# Patient Record
Sex: Female | Born: 1970 | Race: White | Hispanic: No | Smoking: Never smoker
Health system: Southern US, Community
[De-identification: ages and names within clinical notes are randomized; demographics above are authoritative.]

## PROBLEM LIST (undated history)

## (undated) DIAGNOSIS — F329 Major depressive disorder, single episode, unspecified: Secondary | ICD-10-CM

## (undated) DIAGNOSIS — F32A Depression, unspecified: Secondary | ICD-10-CM

## (undated) DIAGNOSIS — K589 Irritable bowel syndrome without diarrhea: Secondary | ICD-10-CM

## (undated) DIAGNOSIS — F419 Anxiety disorder, unspecified: Secondary | ICD-10-CM

## (undated) DIAGNOSIS — R87619 Unspecified abnormal cytological findings in specimens from cervix uteri: Secondary | ICD-10-CM

## (undated) DIAGNOSIS — A630 Anogenital (venereal) warts: Secondary | ICD-10-CM

## (undated) DIAGNOSIS — N879 Dysplasia of cervix uteri, unspecified: Secondary | ICD-10-CM

## (undated) HISTORY — DX: Depression, unspecified: F32.A

## (undated) HISTORY — PX: TUBAL LIGATION: SHX77

## (undated) HISTORY — PX: COLPOSCOPY: SHX161

## (undated) HISTORY — DX: Anxiety disorder, unspecified: F41.9

## (undated) HISTORY — DX: Unspecified abnormal cytological findings in specimens from cervix uteri: R87.619

## (undated) HISTORY — DX: Irritable bowel syndrome, unspecified: K58.9

## (undated) HISTORY — DX: Anogenital (venereal) warts: A63.0

## (undated) HISTORY — DX: Major depressive disorder, single episode, unspecified: F32.9

## (undated) HISTORY — DX: Dysplasia of cervix uteri, unspecified: N87.9

---

## 1998-02-06 ENCOUNTER — Inpatient Hospital Stay (HOSPITAL_COMMUNITY): Admission: AD | Admit: 1998-02-06 | Discharge: 1998-02-08 | Payer: Self-pay | Admitting: Obstetrics & Gynecology

## 1999-09-28 ENCOUNTER — Other Ambulatory Visit: Admission: RE | Admit: 1999-09-28 | Discharge: 1999-09-28 | Payer: Self-pay | Admitting: Gynecology

## 2004-05-24 ENCOUNTER — Encounter: Admission: RE | Admit: 2004-05-24 | Discharge: 2004-05-24 | Payer: Self-pay | Admitting: *Deleted

## 2007-03-06 ENCOUNTER — Encounter: Admission: RE | Admit: 2007-03-06 | Discharge: 2007-03-06 | Payer: Self-pay | Admitting: Internal Medicine

## 2007-10-30 HISTORY — PX: CHOLECYSTECTOMY: SHX55

## 2007-11-19 ENCOUNTER — Encounter: Admission: RE | Admit: 2007-11-19 | Discharge: 2007-11-19 | Payer: Self-pay | Admitting: Gynecology

## 2009-02-15 ENCOUNTER — Encounter: Admission: RE | Admit: 2009-02-15 | Discharge: 2009-02-15 | Payer: Self-pay | Admitting: Obstetrics and Gynecology

## 2009-05-12 ENCOUNTER — Inpatient Hospital Stay (HOSPITAL_COMMUNITY): Admission: RE | Admit: 2009-05-12 | Discharge: 2009-05-12 | Payer: Self-pay | Admitting: Internal Medicine

## 2009-06-06 ENCOUNTER — Inpatient Hospital Stay (HOSPITAL_COMMUNITY): Admission: AD | Admit: 2009-06-06 | Discharge: 2009-06-10 | Payer: Self-pay | Admitting: Obstetrics and Gynecology

## 2009-06-07 ENCOUNTER — Encounter (INDEPENDENT_AMBULATORY_CARE_PROVIDER_SITE_OTHER): Payer: Self-pay | Admitting: Obstetrics and Gynecology

## 2010-02-06 LAB — HM PAP SMEAR

## 2010-08-17 ENCOUNTER — Encounter: Admission: RE | Admit: 2010-08-17 | Discharge: 2010-08-17 | Payer: Self-pay | Admitting: Obstetrics and Gynecology

## 2010-11-19 ENCOUNTER — Encounter: Payer: Self-pay | Admitting: Internal Medicine

## 2011-02-03 LAB — CBC
HCT: 20.6 % — ABNORMAL LOW (ref 36.0–46.0)
HCT: 22.5 % — ABNORMAL LOW (ref 36.0–46.0)
Hemoglobin: 7.2 g/dL — CL (ref 12.0–15.0)
Hemoglobin: 7.2 g/dL — CL (ref 12.0–15.0)
Hemoglobin: 7.8 g/dL — CL (ref 12.0–15.0)
MCHC: 33.9 g/dL (ref 30.0–36.0)
MCHC: 35 g/dL (ref 30.0–36.0)
MCV: 87.7 fL (ref 78.0–100.0)
MCV: 87.9 fL (ref 78.0–100.0)
Platelets: 170 10*3/uL (ref 150–400)
RBC: 2.36 MIL/uL — ABNORMAL LOW (ref 3.87–5.11)
RBC: 2.42 MIL/uL — ABNORMAL LOW (ref 3.87–5.11)
RBC: 3.49 MIL/uL — ABNORMAL LOW (ref 3.87–5.11)
RDW: 16.1 % — ABNORMAL HIGH (ref 11.5–15.5)
RDW: 16.3 % — ABNORMAL HIGH (ref 11.5–15.5)
RDW: 16.7 % — ABNORMAL HIGH (ref 11.5–15.5)
WBC: 12.1 10*3/uL — ABNORMAL HIGH (ref 4.0–10.5)
WBC: 8.9 10*3/uL (ref 4.0–10.5)

## 2011-02-03 LAB — RPR: RPR Ser Ql: NONREACTIVE

## 2011-02-05 LAB — DIFFERENTIAL
Band Neutrophils: 0 % (ref 0–10)
Basophils Absolute: 0 10*3/uL (ref 0.0–0.1)
Basophils Relative: 0 % (ref 0–1)
Eosinophils Absolute: 0.2 10*3/uL (ref 0.0–0.7)
Eosinophils Relative: 2 % (ref 0–5)
Metamyelocytes Relative: 0 %
Monocytes Absolute: 0.2 10*3/uL (ref 0.1–1.0)
Monocytes Relative: 2 % — ABNORMAL LOW (ref 3–12)
Myelocytes: 0 %
nRBC: 0 /100 WBC

## 2011-02-05 LAB — COMPREHENSIVE METABOLIC PANEL
ALT: 17 U/L (ref 0–35)
AST: 22 U/L (ref 0–37)
Albumin: 3 g/dL — ABNORMAL LOW (ref 3.5–5.2)
Calcium: 8.5 mg/dL (ref 8.4–10.5)
Creatinine, Ser: 0.56 mg/dL (ref 0.4–1.2)
GFR calc Af Amer: >60 mL/min (ref >60)
Sodium: 133 mEq/L — ABNORMAL LOW (ref 135–145)
Total Protein: 6.5 g/dL (ref 6.0–8.3)

## 2011-02-05 LAB — CBC
MCHC: 34.4 g/dL (ref 30.0–36.0)
MCV: 88.2 fL (ref 78.0–100.0)
Platelets: 168 10*3/uL (ref 150–400)
WBC: 9.8 10*3/uL (ref 4.0–10.5)

## 2011-02-26 ENCOUNTER — Ambulatory Visit
Admission: RE | Admit: 2011-02-26 | Discharge: 2011-02-26 | Disposition: A | Discharge status: Home / Self Care | Payer: PRIVATE HEALTH INSURANCE | Source: Ambulatory Visit | Attending: Obstetrics and Gynecology | Admitting: Obstetrics and Gynecology

## 2011-02-26 ENCOUNTER — Other Ambulatory Visit: Payer: Self-pay | Admitting: Obstetrics and Gynecology

## 2011-02-26 DIAGNOSIS — R05 Cough: Secondary | ICD-10-CM

## 2011-02-26 DIAGNOSIS — R059 Cough, unspecified: Secondary | ICD-10-CM

## 2011-03-07 ENCOUNTER — Ambulatory Visit: Payer: Self-pay | Admitting: Internal Medicine

## 2011-03-12 ENCOUNTER — Encounter: Payer: Self-pay | Admitting: Gastroenterology

## 2011-03-13 NOTE — Op Note (Signed)
NAMEANITRIA, ANDON            ACCOUNT NO.:  [id number]   MEDICAL RECORD NO.:  [id number]           PATIENT TYPE:   LOCATION:                                 FACILITY:   PHYSICIAN:  Guy Sandifer. Henderson Cloud, M.D. DATE OF BIRTH:  01-May-1971   DATE OF PROCEDURE:  06/07/2009  DATE OF DISCHARGE:                               OPERATIVE REPORT   PREOPERATIVE DIAGNOSES:  1. Arrest of descent.  2. Non-reassuring fetal heart tracing.  3. Desires permanent sterilization.   POSTOPERATIVE DIAGNOSES:  1. Arrest of descent.  2. Non-reassuring fetal heart tracing.  3. Desires permanent sterilization.   PROCEDURE:  Low-transverse cesarean section and bilateral tubal  ligation.   SURGEON:  Guy Sandifer. Henderson Cloud, MD   ANESTHESIA:  Epidural, Quillian Quince, MD   SPECIMENS:  Bilateral fallopian tube segments and placenta to Pathology.   FINDINGS:  Viable female infant, Apgars of 9 and 9 at one and five minutes  respectively.  Arterial cord pH 7.22.  Birth weight 7 pounds 5 ounces.   ESTIMATED BLOOD LOSS:  800 mL.   INDICATIONS AND CONSENT:  This patient is a 40 year old white female G3,  P2, EDC of June 11, 2009, who has a history of a traumatic delivery  with her first baby.  She was admitted for induction of labor at 39-1/2  weeks.  On the morning of June 07, 2009, cervix was completely  dilated, completely effaced, and the vertex was 0 to -1 station.  The  patient pushed for less than 30 minutes.  There were some deep variable  type decelerations.  Oxygen was administered.  The patient was allowed  to rest and labored down.  After approximately 30-40 minutes, the  decelerations remained and the vertex was unchanged as well.  Diagnosis  of arrest of descent and non-reassuring fetal heart tracing was made and  cesarean section was recommended.  Potential risks and complications  were discussed with the patient including, but not limited to infection,  organ damage, bleeding requiring  transfusion of blood products with HIV  and hepatitis acquisition, DVT, PE, pneumonia.  The patient also  requests tubal ligation.  Alternate methods permanence, increased  ectopic risk, failure rate of the procedure was reviewed.  All questions  were answered.  Consent was signed on the chart.   PROCEDURE:  The patient was taken to the operating room where her  epidural anesthetic was augmented to a surgical level.  She was  identified, prepped and draped in sterile fashion.  Time-out was  undertaken.  After testing for adequate epidural anesthesia, skin was  entered through a Pfannenstiel incision and dissection was carried out  in the layers to the peritoneum.  Peritoneum was incised, extended  superiorly and inferiorly.  Vesicouterine peritoneum was taken down  cephalad laterally.  The bladder flap was developed and bladder blade  was placed.  Uterus was incised in a low transverse manner and the  uterine cavity was entered bluntly with a hemostat.  The uterine  incision was extended cephalad laterally with fingers.  A nuchal cord  with a lot of loose cord present  at the incision.  Vertex was then  delivered.  The oronasal pharynx were suctioned.  The cord was reduced  and the baby was delivered without difficulty.  Cord was clamped and  cut.  The baby was handed to the awaiting pediatrics team.  Placenta was  manually delivered and sent to Pathology.  Uterine cavity was clean.  A  running locking layer of Monocryl was used to close the uterine  incision.  In order to better visualize the incision, the fundus was  delivered through the incision.  There was bleeding at the left ankle.  With careful attention paid posteriorly and a hand protecting the bowel  posteriorly, the left angle was controlled with 0 Monocryl suture, which  was also continued on in a locking imbricating fashion across the entire  incision.  Good hemostasis was noted.  Single left figure-of-eight was  also  applied at the right lower aspect of the incision to obtain  complete hemostasis.  Ovaries were normal bilaterally.  Left fallopian  tubes identified from cornu to fimbria.  A knuckle of tube was doubly  ligated with 2 free ties of 0 plain suture.  The intervening knuckle was  then sharply resected.  Cautery was used to assure complete hemostasis.  Similar procedure was carried out on the right side.  Fundus was  returned to the abdomen.  Copious irrigation was carried out and all  returns was clear.  The anterior peritoneum was closed in running  fashion with 0 Monocryl suture, which was also used to reapproximate the  pyramidalis muscle in the midline.  Anterior rectus fascia was closed in  running fashion with double looped 0 PDS suture and the skin was closed  with clips.  All sponge, instrument, and needle counts were correct, and  the patient was transferred to recovery room in stable condition.      Guy Sandifer Henderson Cloud, M.D.  Electronically Signed     JET/MEDQ  D:  06/07/2009  T:  06/07/2009  Job:  [id number]

## 2011-03-28 ENCOUNTER — Ambulatory Visit: Payer: Self-pay | Admitting: Internal Medicine

## 2011-03-28 DIAGNOSIS — Z0289 Encounter for other administrative examinations: Secondary | ICD-10-CM

## 2011-04-11 ENCOUNTER — Telehealth: Payer: Self-pay | Admitting: *Deleted

## 2011-04-11 NOTE — Telephone Encounter (Signed)
Called pt to advise pt that she has seen Dr Dulce Sellar in 07/2010 therefore she would have to get all of her Gi records faxed to our office for review. As I am telling her this she states that she has seen Dr Jarold Motto in the 1990's but would now want to see Dr Juanda Chance. I have asked the PCCs to check and see if she has a Agricultural engineer. I have advised her that we will see about getting her old chart up to review as well but she would have to still have all of Dr Malena Edman records sent to our office for review. I have cxed her appt for 05/19/2011 until we have an answer as to if she is accepted into the practice. She verbalized understanding.

## 2011-04-19 ENCOUNTER — Ambulatory Visit: Payer: PRIVATE HEALTH INSURANCE | Admitting: Gastroenterology

## 2011-09-14 IMAGING — MG MM SCREEN MAMMOGRAM BILATERAL
4 series · 4 of 4 positions shown · non-contrast
Comparison: 11/19/2007.

CLINICAL DATA: Screening.

BILATERAL DIGITAL SCREENING MAMMOGRAM WITH CAD

[R CC]
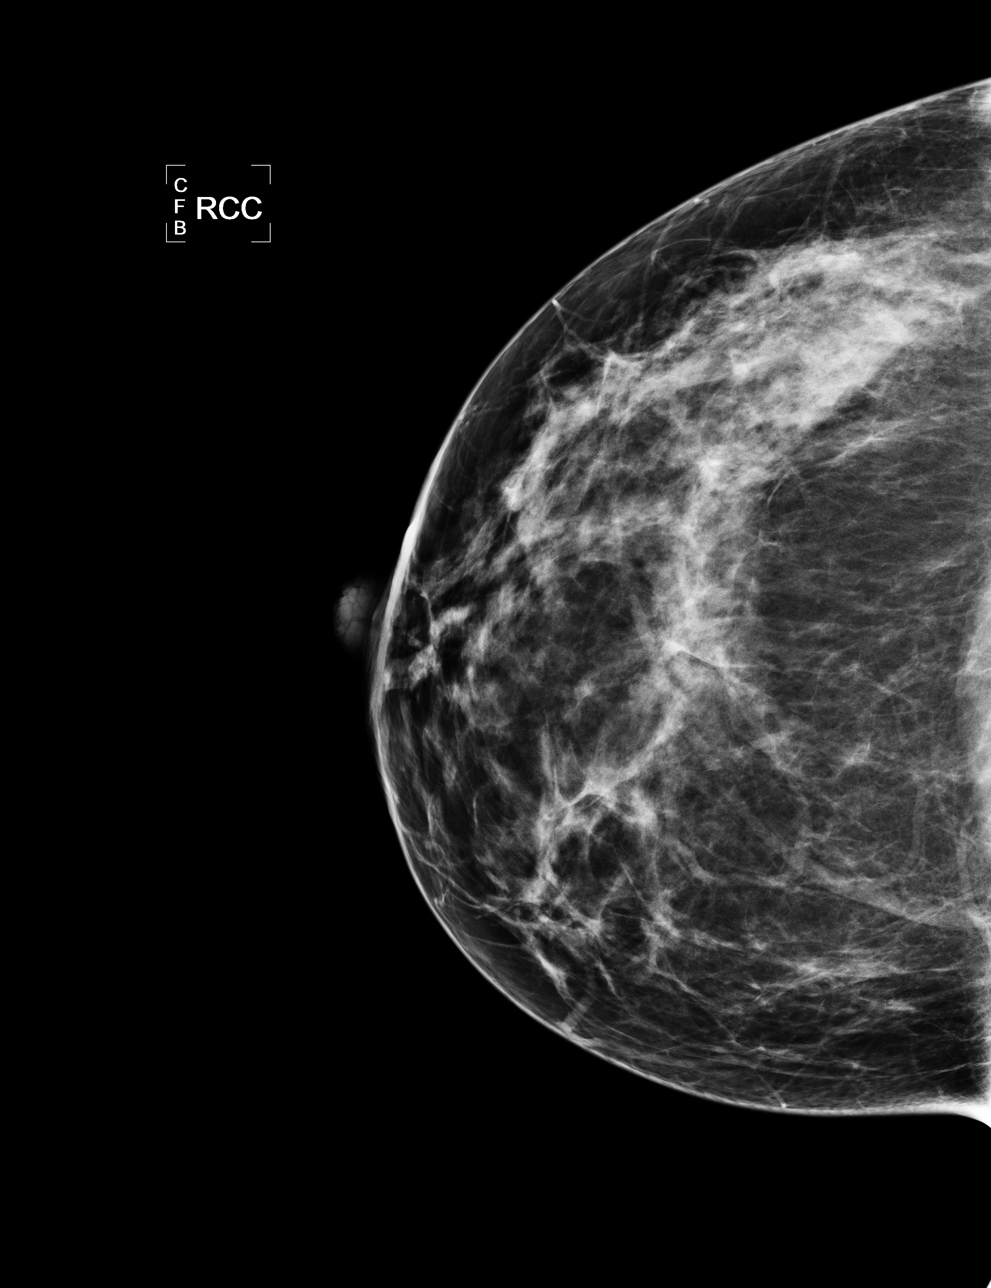

[L CC]
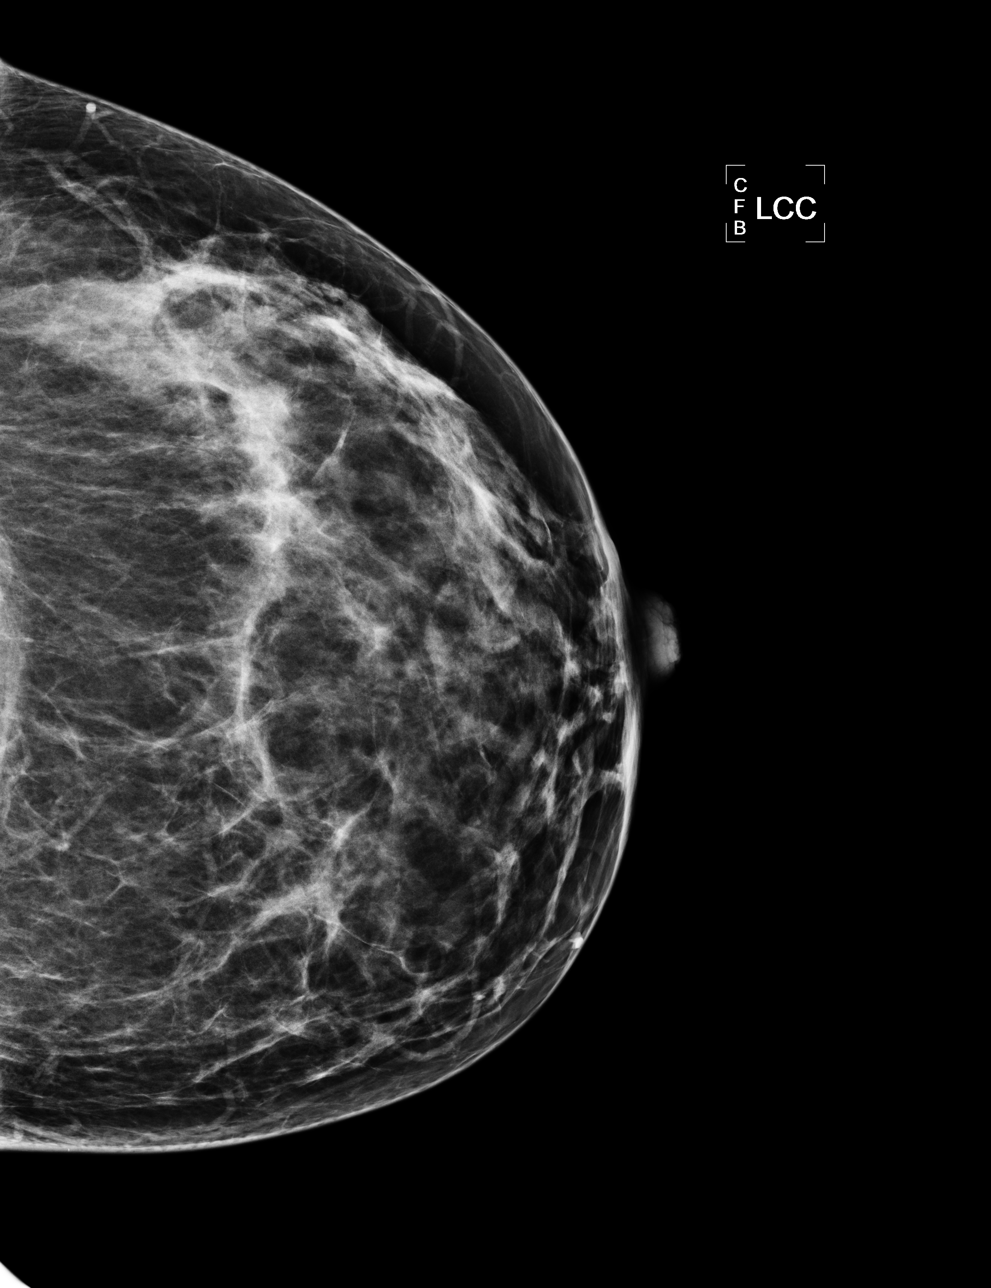

[L MLO]
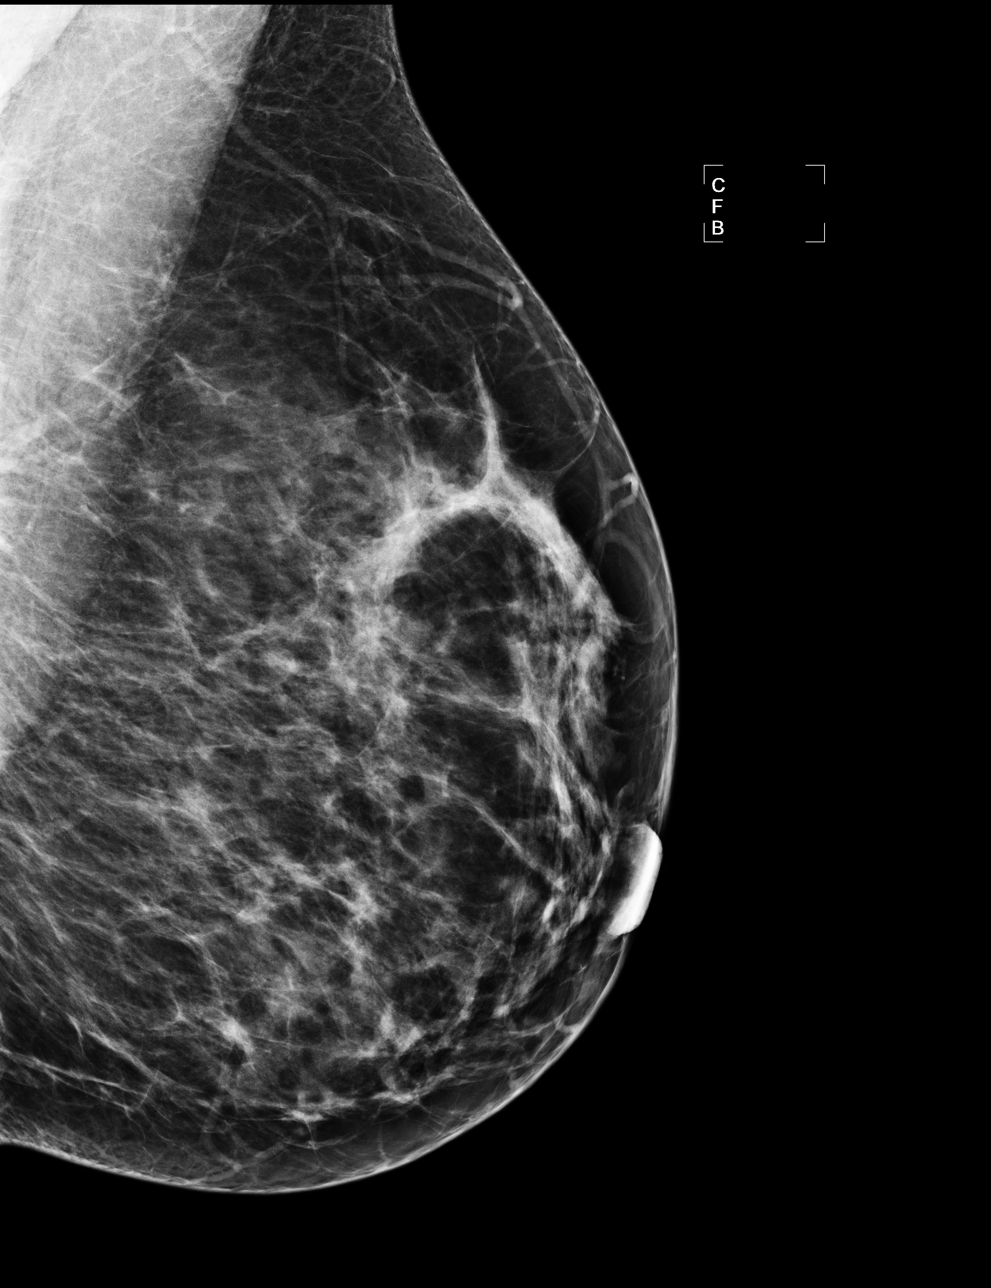

[R MLO]
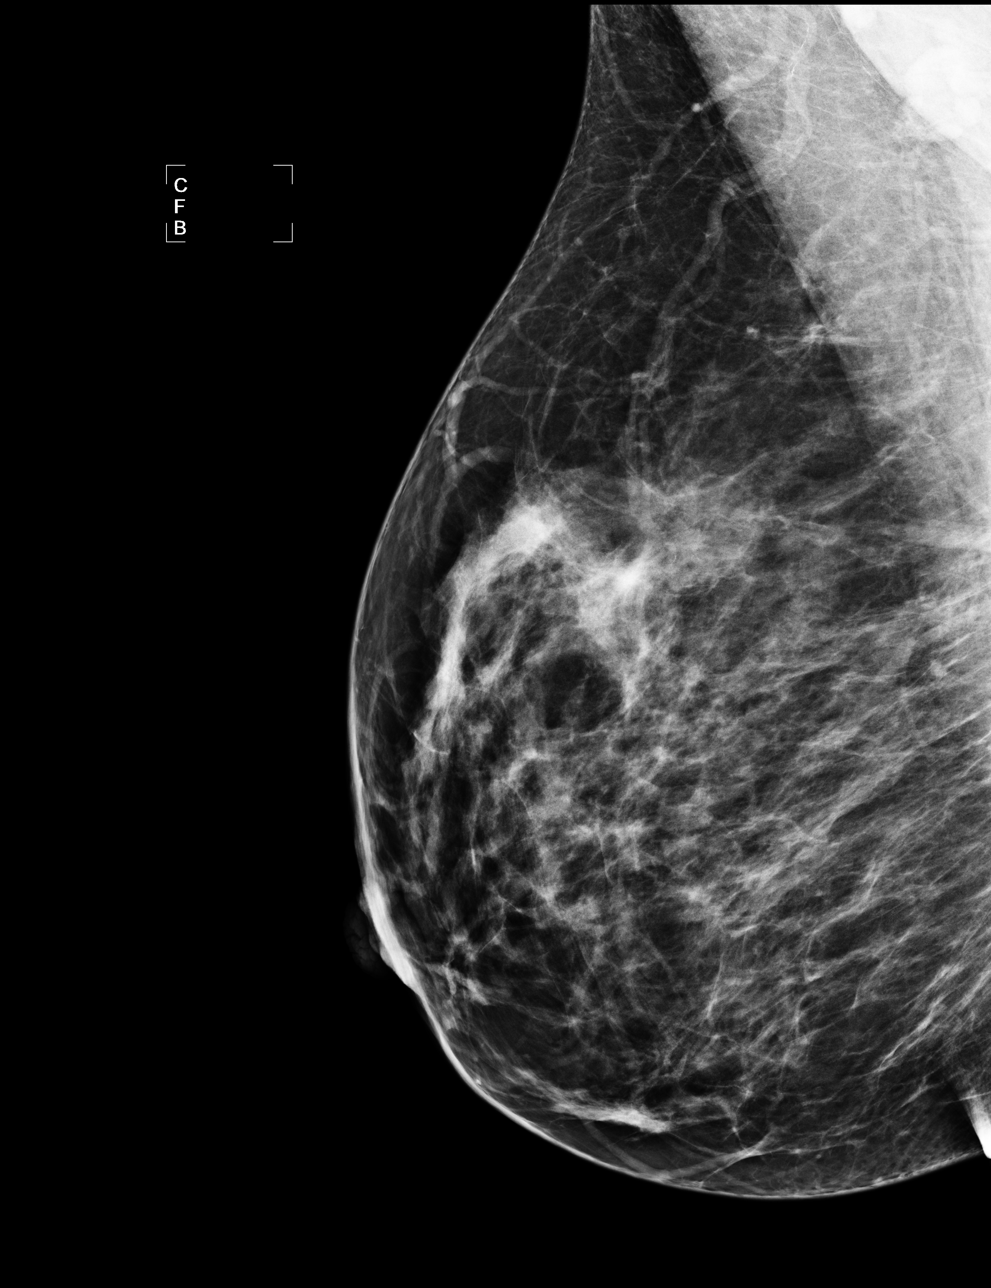

[4 of 4 positions shown; findings below may reference images not displayed]

FINDINGS: Mammographic views of both breasts demonstrate stable
scattered fibroglandular tissue in both breasts with no
mammographic findings suspicious for malignancy.

Mammographic images were processed with CAD.
IMPRESSION: No evidence of malignancy.  Annual screening mammography is
recommended beginning at age 40.

BI-RADS CATEGORY 1:  Negative.

## 2013-11-03 ENCOUNTER — Other Ambulatory Visit: Payer: Self-pay

## 2013-11-03 ENCOUNTER — Ambulatory Visit
Admission: RE | Admit: 2013-11-03 | Discharge: 2013-11-03 | Disposition: A | Discharge status: Home / Self Care | Payer: PRIVATE HEALTH INSURANCE | Source: Ambulatory Visit

## 2013-11-03 DIAGNOSIS — Z1231 Encounter for screening mammogram for malignant neoplasm of breast: Secondary | ICD-10-CM

## 2015-03-30 ENCOUNTER — Ambulatory Visit
Admission: RE | Admit: 2015-03-30 | Discharge: 2015-03-30 | Disposition: A | Discharge status: Home / Self Care | Payer: PRIVATE HEALTH INSURANCE | Source: Ambulatory Visit | Attending: Obstetrics and Gynecology | Admitting: Obstetrics and Gynecology

## 2015-03-30 ENCOUNTER — Other Ambulatory Visit: Payer: Self-pay | Admitting: Obstetrics and Gynecology

## 2015-03-30 DIAGNOSIS — Z1231 Encounter for screening mammogram for malignant neoplasm of breast: Secondary | ICD-10-CM

## 2015-04-07 ENCOUNTER — Other Ambulatory Visit: Payer: Self-pay | Admitting: *Deleted

## 2015-06-30 HISTORY — PX: AUGMENTATION MAMMAPLASTY: SUR837

## 2015-11-16 DIAGNOSIS — F411 Generalized anxiety disorder: Secondary | ICD-10-CM | POA: Insufficient documentation

## 2015-11-16 DIAGNOSIS — F418 Other specified anxiety disorders: Secondary | ICD-10-CM | POA: Insufficient documentation

## 2015-11-16 DIAGNOSIS — G4700 Insomnia, unspecified: Secondary | ICD-10-CM | POA: Insufficient documentation

## 2015-11-16 DIAGNOSIS — N920 Excessive and frequent menstruation with regular cycle: Secondary | ICD-10-CM | POA: Insufficient documentation

## 2015-11-16 DIAGNOSIS — K580 Irritable bowel syndrome with diarrhea: Secondary | ICD-10-CM | POA: Insufficient documentation

## 2016-05-07 ENCOUNTER — Other Ambulatory Visit: Payer: Self-pay | Admitting: *Deleted

## 2016-05-07 ENCOUNTER — Ambulatory Visit
Admission: RE | Admit: 2016-05-07 | Discharge: 2016-05-07 | Disposition: A | Discharge status: Home / Self Care | Payer: PRIVATE HEALTH INSURANCE | Source: Ambulatory Visit | Attending: *Deleted | Admitting: *Deleted

## 2016-05-07 DIAGNOSIS — Z1231 Encounter for screening mammogram for malignant neoplasm of breast: Secondary | ICD-10-CM

## 2016-05-10 ENCOUNTER — Ambulatory Visit: Payer: PRIVATE HEALTH INSURANCE

## 2017-04-10 ENCOUNTER — Other Ambulatory Visit: Payer: Self-pay | Admitting: *Deleted

## 2017-04-10 DIAGNOSIS — Z1231 Encounter for screening mammogram for malignant neoplasm of breast: Secondary | ICD-10-CM

## 2017-05-08 ENCOUNTER — Ambulatory Visit
Admission: RE | Admit: 2017-05-08 | Discharge: 2017-05-08 | Disposition: A | Discharge status: Home / Self Care | Payer: PRIVATE HEALTH INSURANCE | Source: Ambulatory Visit | Attending: *Deleted | Admitting: *Deleted

## 2017-05-08 DIAGNOSIS — Z1231 Encounter for screening mammogram for malignant neoplasm of breast: Secondary | ICD-10-CM

## 2017-06-20 ENCOUNTER — Encounter: Payer: PRIVATE HEALTH INSURANCE | Admitting: Obstetrics and Gynecology

## 2017-08-07 ENCOUNTER — Other Ambulatory Visit (HOSPITAL_COMMUNITY)
Admission: RE | Admit: 2017-08-07 | Discharge: 2017-08-07 | Disposition: A | Discharge status: Home / Self Care | Payer: PRIVATE HEALTH INSURANCE | Source: Ambulatory Visit | Attending: Certified Nurse Midwife | Admitting: Certified Nurse Midwife

## 2017-08-07 ENCOUNTER — Encounter: Payer: Self-pay | Admitting: Certified Nurse Midwife

## 2017-08-07 ENCOUNTER — Ambulatory Visit (INDEPENDENT_AMBULATORY_CARE_PROVIDER_SITE_OTHER): Payer: PRIVATE HEALTH INSURANCE | Admitting: Certified Nurse Midwife

## 2017-08-07 VITALS — BP 110/72 | HR 70 | Resp 16 | Ht 63.75 in | Wt 132.0 lb

## 2017-08-07 DIAGNOSIS — Z8742 Personal history of other diseases of the female genital tract: Secondary | ICD-10-CM

## 2017-08-07 DIAGNOSIS — Z01419 Encounter for gynecological examination (general) (routine) without abnormal findings: Secondary | ICD-10-CM | POA: Diagnosis not present

## 2017-08-07 DIAGNOSIS — Z124 Encounter for screening for malignant neoplasm of cervix: Secondary | ICD-10-CM | POA: Insufficient documentation

## 2017-08-07 DIAGNOSIS — Z87898 Personal history of other specified conditions: Secondary | ICD-10-CM | POA: Diagnosis not present

## 2017-08-07 DIAGNOSIS — Z0000 Encounter for general adult medical examination without abnormal findings: Secondary | ICD-10-CM

## 2017-08-07 LAB — POCT URINALYSIS DIPSTICK
Bilirubin, UA: NEGATIVE
GLUCOSE UA: NEGATIVE
Ketones, UA: NEGATIVE
Nitrite, UA: NEGATIVE
PROTEIN UA: NEGATIVE
UROBILINOGEN UA: NEGATIVE U/dL — AB
pH, UA: 5 (ref 5.0–8.0)

## 2017-08-07 NOTE — Patient Instructions (Addendum)

## 2017-08-07 NOTE — Progress Notes (Addendum)
46 y.o. G3P0 Divorced  Caucasian Fe here to establish gyn care and  for annual exam. Periods now normal and no spotting with Seasonique (with PCP). Has noted some hot flashes at times on the last week of pack. Was started on OCP about year ago for cycle control Does have spotting at times and stops OCP and has period. This was better with no spotting. Feels like she finally is adjusting and no menstrual cramps. Desires labs if indicated. Mainly worried about having pap smear. Had history of 3 abnormal pap smear with colposcopy with 2 and declined the third. Previous provider [facility]. Patient now sexually active after many years,with condom use. No STD concerns but would like to have screening. Slight vaginal change, some soreness after last sexual activity. No bleeding or odor. Some hand stimulation ? Is this the problem? Used lubrication, with no problems. No other health concerns today. Patient is [place] [facility] [facility] and is "happy she is establishing with [facility] today".  Patient's last menstrual period was 08/03/2017 (exact date).          Sexually active: Yes.    The current method of family planning is tubal ligation.    Exercising: Yes.    cardio & weights Smoker:  no  Health Maintenance: Pap:  98yrs ago neg History of Abnormal Pap: 2010,2011,2012, 2013 [facility] per records, per patient 2 colpos and just repeated paps after results in   MMG:  05-08-17 category b density birads 1:neg, started with baseline at 37. Self Breast exams: yes Colonoscopy:  none BMD:   none TDaP:  Unsure, feels she is up to date Shingles: no Pneumonia: no Hep C and HIV: HIV labs during pregnancy Labs: if needed.   reports that she has never smoked. She has never used smokeless tobacco. She reports that she does not drink alcohol or use drugs.  Past Medical History:  Diagnosis Date  . Abnormal Pap smear of cervix   . Anxiety   . Cervical dysplasia   . Condyloma   . Depression   . IBS  (irritable bowel syndrome)     Past Surgical History:  Procedure Laterality Date  . AUGMENTATION MAMMAPLASTY Bilateral 06/2015  . CESAREAN SECTION  2010  . CHOLECYSTECTOMY  2009  . COLPOSCOPY     times 2  . TUBAL LIGATION      Current Outpatient Prescriptions  Medication Sig Dispense Refill  . ALPRAZolam (XANAX) 0.5 MG tablet as needed  5  . Levonorgestrel-Ethinyl Estradiol (AMETHIA,CAMRESE) 0.15-0.03 &0.01 MG tablet Take 1 tablet by mouth daily. as directed  3  . VIBERZI 100 MG TABS Take 1 tablet by mouth 2 (two) times daily.  1  . zolpidem (AMBIEN) 10 MG tablet Take 10 mg by mouth at bedtime as needed.       No current facility-administered medications for this visit.     Family History  Problem Relation Age of Onset  . Diabetes Mother   . Hypertension Mother   . Heart attack Mother        enlarged heart  . Diabetes Father   . Hypertension Father   . Diabetes Maternal Grandmother   . Hypertension Maternal Grandmother   . Diabetes Maternal Grandfather   . Hypertension Maternal Grandfather   . Diabetes Paternal Grandmother   . Hypertension Paternal Grandmother   . Diabetes Paternal Grandfather   . Hypertension Paternal Grandfather     ROS:  Pertinent items are noted in HPI.  Otherwise, a comprehensive  ROS was negative.  Exam:   BP 110/72   Pulse 70   Resp 16   Ht 5' 3.75" (1.619 m)   Wt 132 lb (59.9 kg)   LMP 08/03/2017 (Exact Date)   BMI 22.84 kg/m  Height: 5' 3.75" (161.9 cm) Ht Readings from Last 3 Encounters:  08/07/17 5' 3.75" (1.619 m)    General appearance: alert, cooperative and appears stated age Head: Normocephalic, without obvious abnormality, atraumatic Neck: no adenopathy, supple, symmetrical, trachea midline and thyroid normal to inspection and palpation Lungs: clear to auscultation bilaterally Breasts: normal appearance, no masses or tenderness, No nipple retraction or dimpling, No nipple discharge or bleeding, No axillary or supraclavicular  adenopathy Heart: regular rate and rhythm Abdomen: soft, non-tender; no masses,  no organomegaly Extremities: extremities normal, atraumatic, no cyanosis or edema Skin: Skin color, texture, turgor normal. No rashes or lesions Lymph nodes: Cervical, supraclavicular, and axillary nodes normal. No abnormal inguinal nodes palpated Neurologic: Grossly normal   Pelvic: External genitalia:  no lesions              Urethra:  normal appearing urethra with no masses, tenderness or lesions              Bartholin's and Skene's: normal                 Vagina: normal appearing vagina with normal color and discharge, no lesions, small area of increase pink and tenderness at fourchette, patient identified as the point of discomfort, healing area from ? Hand stimulation noted and shown to patient in mirror              Cervix: no cervical motion tenderness and no lesions              Pap taken: Yes.   Bimanual Exam:  Uterus:  normal size, contour, position, consistency, mobility, non-tender and anteverted              Adnexa: normal adnexa and no mass, fullness, tenderness               Rectovaginal: Confirms               Anus:  normal sphincter tone, no lesions  Chaperone present: yes  A:  Well Woman with normal exam  Contraception OCP Seasonique PCP manages,but would like management here if possible  History of abnormal pap smear x 3 with 2 colposcopies, ? Etiology will request records.  Vaginal superficial healing abrasion  Insomnia with PCP management  IBS with Dr. Loreta Ave management  Screening labs      P:   Reviewed health and wellness pertinent to exam  Patient will advise when she needs rx.  Will advise once records in  Discussed finding and need to use moisture to protect area, will try coconut oil.  Continue follow up with MD as indicated  Labs; STD panel, Hep C, GC/Chlamlydia, Vaginal screen  Pap smear: yes   counseled on breast self exam, mammography screening, STD prevention, HIV  risk factors and prevention, use and side effects of OCP's, adequate intake of calcium and vitamin D, diet and exercise  return annually or prn  An After Visit Summary was printed and given to the patient.

## 2017-08-08 LAB — HEPATITIS C ANTIBODY

## 2017-08-08 LAB — VAGINITIS/VAGINOSIS, DNA PROBE
Candida Species: NEGATIVE
Gardnerella vaginalis: NEGATIVE
Trichomonas vaginosis: NEGATIVE

## 2017-08-08 LAB — HEP, RPR, HIV PANEL
HIV SCREEN 4TH GENERATION: NONREACTIVE
Hepatitis B Surface Ag: NEGATIVE
RPR Ser Ql: NONREACTIVE

## 2017-08-08 LAB — GC/CHLAMYDIA PROBE AMP
CHLAMYDIA, DNA PROBE: NEGATIVE
Neisseria gonorrhoeae by PCR: NEGATIVE

## 2017-08-09 LAB — CYTOLOGY - PAP
DIAGNOSIS: NEGATIVE
HPV (WINDOPATH): NOT DETECTED

## 2018-04-03 ENCOUNTER — Emergency Department (HOSPITAL_BASED_OUTPATIENT_CLINIC_OR_DEPARTMENT_OTHER): Payer: PRIVATE HEALTH INSURANCE

## 2018-04-03 ENCOUNTER — Encounter (HOSPITAL_BASED_OUTPATIENT_CLINIC_OR_DEPARTMENT_OTHER): Payer: Self-pay | Admitting: Emergency Medicine

## 2018-04-03 ENCOUNTER — Emergency Department (HOSPITAL_BASED_OUTPATIENT_CLINIC_OR_DEPARTMENT_OTHER)
Admission: EM | Admit: 2018-04-03 | Discharge: 2018-04-03 | Disposition: A | Discharge status: Home / Self Care | Payer: PRIVATE HEALTH INSURANCE | Attending: Emergency Medicine | Admitting: Emergency Medicine

## 2018-04-03 ENCOUNTER — Other Ambulatory Visit: Payer: Self-pay

## 2018-04-03 DIAGNOSIS — R1030 Lower abdominal pain, unspecified: Secondary | ICD-10-CM | POA: Insufficient documentation

## 2018-04-03 DIAGNOSIS — Z79899 Other long term (current) drug therapy: Secondary | ICD-10-CM | POA: Insufficient documentation

## 2018-04-03 DIAGNOSIS — R197 Diarrhea, unspecified: Secondary | ICD-10-CM | POA: Diagnosis not present

## 2018-04-03 LAB — COMPREHENSIVE METABOLIC PANEL
ALT: 15 U/L (ref 14–54)
AST: 18 U/L (ref 15–41)
Albumin: 3.7 g/dL (ref 3.5–5.0)
Alkaline Phosphatase: 44 U/L (ref 38–126)
Anion gap: 7 (ref 5–15)
BUN: 8 mg/dL (ref 6–20)
CHLORIDE: 112 mmol/L — AB (ref 101–111)
CO2: 23 mmol/L (ref 22–32)
Calcium: 8.4 mg/dL — ABNORMAL LOW (ref 8.9–10.3)
Creatinine, Ser: 0.81 mg/dL (ref 0.44–1.00)
Glucose, Bld: 101 mg/dL — ABNORMAL HIGH (ref 65–99)
Potassium: 3.8 mmol/L (ref 3.5–5.1)
SODIUM: 142 mmol/L (ref 135–145)
Total Bilirubin: 0.2 mg/dL — ABNORMAL LOW (ref 0.3–1.2)
Total Protein: 6.9 g/dL (ref 6.5–8.1)

## 2018-04-03 LAB — CBC
HEMATOCRIT: 39.4 % (ref 36.0–46.0)
Hemoglobin: 13.9 g/dL (ref 12.0–15.0)
MCH: 32 pg (ref 26.0–34.0)
MCHC: 35.3 g/dL (ref 30.0–36.0)
MCV: 90.8 fL (ref 78.0–100.0)
Platelets: 189 10*3/uL (ref 150–400)
RBC: 4.34 MIL/uL (ref 3.87–5.11)
RDW: 12 % (ref 11.5–15.5)
WBC: 5.7 10*3/uL (ref 4.0–10.5)

## 2018-04-03 LAB — URINALYSIS, ROUTINE W REFLEX MICROSCOPIC
Bilirubin Urine: NEGATIVE
GLUCOSE, UA: NEGATIVE mg/dL
Ketones, ur: NEGATIVE mg/dL
LEUKOCYTES UA: NEGATIVE
Nitrite: NEGATIVE
PH: 5.5 (ref 5.0–8.0)
PROTEIN: NEGATIVE mg/dL
Specific Gravity, Urine: >1.03 — ABNORMAL HIGH (ref 1.005–1.030)

## 2018-04-03 LAB — LIPASE, BLOOD: Lipase: 52 U/L — ABNORMAL HIGH (ref 11–51)

## 2018-04-03 LAB — URINALYSIS, MICROSCOPIC (REFLEX)

## 2018-04-03 LAB — OCCULT BLOOD X 1 CARD TO LAB, STOOL: Fecal Occult Bld: NEGATIVE

## 2018-04-03 LAB — HCG, QUANTITATIVE, PREGNANCY

## 2018-04-03 MED ORDER — ONDANSETRON HCL 4 MG/2ML IJ SOLN
4.0000 mg | Freq: Once | INTRAMUSCULAR | Status: AC | PRN
  Administered 2018-04-03: 4 mg via INTRAVENOUS
  Filled 2018-04-03: qty 2

## 2018-04-03 MED ORDER — SODIUM CHLORIDE 0.9 % IV BOLUS
1000.0000 mL | Freq: Once | INTRAVENOUS | Status: AC
  Administered 2018-04-03: 1000 mL via INTRAVENOUS

## 2018-04-03 MED ORDER — IOPAMIDOL (ISOVUE-300) INJECTION 61%
100.0000 mL | Freq: Once | INTRAVENOUS | Status: AC | PRN
  Administered 2018-04-03: 100 mL via INTRAVENOUS

## 2018-04-03 MED ORDER — IOPAMIDOL (ISOVUE-300) INJECTION 61%
30.0000 mL | Freq: Once | INTRAVENOUS | Status: AC | PRN
  Administered 2018-04-03: 15 mL via ORAL

## 2018-04-03 MED ORDER — MORPHINE SULFATE (PF) 4 MG/ML IV SOLN: 4.0000 mg | Freq: Once | INTRAVENOUS | Status: DC | PRN

## 2018-04-03 MED ORDER — PROMETHAZINE HCL 25 MG PO TABS: 25.0000 mg | ORAL_TABLET | Freq: Four times a day (QID) | ORAL | 0 refills | Status: AC | PRN

## 2018-04-03 MED FILL — PROMETHAZINE 25 MG TABLET: 25 | 5 days supply | Qty: 20 | Fill #0

## 2018-04-03 NOTE — ED Triage Notes (Signed)
Pt reports abd pain with diarrhea x 3 days.

## 2018-04-03 NOTE — ED Provider Notes (Signed)
[facility] EMERGENCY DEPARTMENT Provider Note   CSN: [id number] Arrival date & time: 04/03/18  1019     History   Chief Complaint Chief Complaint  Patient presents with  . Abdominal Pain  . Diarrhea    HPI Cynthia Parker is a 47 y.o. female.  The history is provided by the patient and medical records. No language interpreter was used.  Abdominal Pain   Associated symptoms include diarrhea and nausea. Pertinent negatives include vomiting and constipation.  Diarrhea   Associated symptoms include abdominal pain. Pertinent negatives include no vomiting.   Cynthia Parker is a 47 y.o. female  with a PMH of IBS who presents to the Emergency Department complaining of diffuse lower abdominal pain over the last 5 days, acutely worsening over the last day or 2.  Associated symptoms include diarrhea.  This morning, she noticed blood in her diarrhea.  She has had this before when she had a flare of her IBS.  She was seen by GI at that time and told that the bleeding was due to to her multiple episodes of diarrhea.  She is on Viberzi for IBS.  She has also been taking Phenergan at home with some improvement in her nausea.  She is also tried Imodium with no improvement in diarrhea.  She has not been vomiting.  She has had no fever.  No known sick contacts.  She reports that her IBS flares typically or not this severe and will only last a day or 2.  The fact that they have not improved, but instead worsened, worried her, prompting her to seek emergency care today.   Past Medical History:  Diagnosis Date  . Abnormal Pap smear of cervix   . Anxiety   . Cervical dysplasia   . Condyloma   . Depression   . IBS (irritable bowel syndrome)     Patient Active Problem List   Diagnosis Date Noted  . 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 Generalized anxiety disorder 11/16/2015  . Insomnia 11/16/2015  . Irritable bowel syndrome with diarrhea 11/16/2015  . Menorrhagia with regular cycle  11/16/2015    Past Surgical History:  Procedure Laterality Date  . AUGMENTATION MAMMAPLASTY Bilateral 06/2015  . CESAREAN SECTION  2010  . CHOLECYSTECTOMY  2009  . COLPOSCOPY     times 2  . TUBAL LIGATION       OB History    Gravida  3   Para      Term      Preterm      AB      Living  3     SAB      TAB      Ectopic      Multiple      Live Births               Home Medications    Prior to Admission medications   Medication Sig Start Date End Date Taking? Authorizing Provider  ALPRAZolam Prudy Feeler) 0.5 MG tablet as needed 06/25/17   [provider]  Levonorgestrel-Ethinyl Estradiol (AMETHIA,CAMRESE) 0.15-0.03 &0.01 MG tablet Take 1 tablet by mouth daily. as directed 07/12/17   [provider]  promethazine (PHENERGAN) 25 MG tablet Take 1 tablet (25 mg total) by mouth every 6 (six) hours as needed for nausea. 04/03/18   Halsey Persaud, Chase Picket, PA-C  VIBERZI 100 MG TABS Take 1 tablet by mouth 2 (two) times daily. 06/24/17   [provider]  zolpidem (AMBIEN) 10  MG tablet Take 10 mg by mouth at bedtime as needed.      [provider]    Family History Family History  Problem Relation Age of Onset  . Diabetes Mother   . Hypertension Mother   . Heart attack Mother        enlarged heart  . Diabetes Father   . Hypertension Father   . Diabetes Maternal Grandmother   . Hypertension Maternal Grandmother   . Diabetes Maternal Grandfather   . Hypertension Maternal Grandfather   . Diabetes Paternal Grandmother   . Hypertension Paternal Grandmother   . Diabetes Paternal Grandfather   . Hypertension Paternal Grandfather     Social History Social History   Tobacco Use  . Smoking status: Never Smoker  . Smokeless tobacco: Never Used  Substance Use Topics  . Alcohol use: No  . Drug use: No     Allergies   Venlafaxine   Review of Systems Review of Systems  Gastrointestinal: Positive for abdominal pain, blood in stool,  diarrhea and nausea. Negative for constipation and vomiting.  All other systems reviewed and are negative.    Physical Exam Updated Vital Signs BP 104/73 (BP Location: Left Arm)   Pulse 78   Temp 98.5 F (36.9 C) (Oral)   Resp 16   Ht 5\' 4"  (1.626 m)   Wt 59.4 kg (131 lb)   SpO2 100%   BMI 22.49 kg/m   Physical Exam  Constitutional: She is oriented to person, place, and time. She appears well-developed and well-nourished. No distress.  Non-toxic appearing.  HENT:  Head: Normocephalic and atraumatic.  Neck: Neck supple.  Cardiovascular: Normal rate, regular rhythm and normal heart sounds.  No murmur heard. Pulmonary/Chest: Effort normal and breath sounds normal. No respiratory distress.  Abdominal: Soft. She exhibits no distension. There is tenderness (LLQ).  Genitourinary: Rectal exam shows guaiac negative stool.  Genitourinary Comments: No external hemorrhoid or fissure present.  Neurological: She is alert and oriented to person, place, and time.  Skin: Skin is warm and dry.  Nursing note and vitals reviewed.    ED Treatments / Results  Labs (all labs ordered are listed, but only abnormal results are displayed) Labs Reviewed  LIPASE, BLOOD - Abnormal; Notable for the following components:      Result Value   Lipase 52 (*)    All other components within normal limits  COMPREHENSIVE METABOLIC PANEL - Abnormal; Notable for the following components:   Chloride 112 (*)    Glucose, Bld 101 (*)    Calcium 8.4 (*)    Total Bilirubin 0.2 (*)    All other components within normal limits  URINALYSIS, ROUTINE W REFLEX MICROSCOPIC - Abnormal; Notable for the following components:   Specific Gravity, Urine >1.030 (*)    Hgb urine dipstick SMALL (*)    All other components within normal limits  URINALYSIS, MICROSCOPIC (REFLEX) - Abnormal; Notable for the following components:   Bacteria, UA MANY (*)    All other components within normal limits  CBC  OCCULT BLOOD X 1 CARD  TO LAB, STOOL  HCG, QUANTITATIVE, PREGNANCY    EKG None  Radiology Ct Abdomen Pelvis W Contrast  Result Date: 04/03/2018 CLINICAL DATA:  Lower abdominal pain with nausea and diarrhea EXAM: CT ABDOMEN AND PELVIS WITH CONTRAST TECHNIQUE: Multidetector CT imaging of the abdomen and pelvis was performed using the standard protocol following bolus administration of intravenous contrast. Oral contrast was also administered. CONTRAST:  15mL ISOVUE-300 IOPAMIDOL (ISOVUE-300)  INJECTION 61%, 100mL ISOVUE-300 IOPAMIDOL (ISOVUE-300) INJECTION 61% COMPARISON:  None. FINDINGS: Lower chest: Lung bases are clear. Hepatobiliary: No focal liver lesions are appreciable. Gallbladder is absent. There is intrahepatic biliary duct dilatation. The common bile duct is not appreciably dilated. There is no biliary duct mass or calculus evident. Pancreas: There is no pancreatic mass or inflammatory focus. Spleen: No splenic lesions are evident. Adrenals/Urinary Tract: Adrenals bilaterally appear normal. Kidneys bilaterally show no evident mass or hydronephrosis on either side. There is no appreciable renal or ureteral calculus on either side. Urinary bladder is midline with wall thickness within normal limits. Stomach/Bowel: There is no appreciable bowel wall or mesenteric thickening. There is no bowel obstruction. No free air or portal venous air. There is mild lipomatous infiltration of the ileocecal valve. Vascular/Lymphatic: There is no abdominal aortic aneurysm. No vascular lesions are evident. There is no adenopathy evident in the abdomen or pelvis. Reproductive: Uterus is anteverted. There is no evident pelvic mass. Other: Appendix appears unremarkable. No abscess or ascites is evident in the abdomen or pelvis. Musculoskeletal: There are no blastic or lytic bone lesions. There is no intramuscular or abdominal wall lesion. Breast implants are noted bilaterally. IMPRESSION: 1. No evident bowel obstruction. No evident  diverticulitis. No abscess in the abdomen or pelvis. Appendix appears unremarkable. 2.  No renal or ureteral calculus.  No hydronephrosis. 3. Gallbladder absent. There is intrahepatic biliary duct dilatation. No common bile duct dilatation. No biliary duct mass or calculus evident by CT. Intrahepatic biliary duct dilatation is probably a consequence of post cholecystectomy state. Electronically Signed   By: Bretta BangWilliam  Woodruff III M.D.   On: 04/03/2018 12:43    Procedures Procedures (including critical care time)  Medications Ordered in ED Medications  morphine 4 MG/ML injection 4 mg (has no administration in time range)  sodium chloride 0.9 % bolus 1,000 mL (0 mLs Intravenous Stopped 04/03/18 1208)  ondansetron (ZOFRAN) injection 4 mg (4 mg Intravenous Given 04/03/18 1141)  iopamidol (ISOVUE-300) 61 % injection 30 mL (15 mLs Oral Contrast Given 04/03/18 1214)  iopamidol (ISOVUE-300) 61 % injection 100 mL (100 mLs Intravenous Contrast Given 04/03/18 1215)     Initial Impression / Assessment and Plan / ED Course  I have reviewed the triage vital signs and the nursing notes.  Pertinent labs & imaging results that were available during my care of the patient were reviewed by me and considered in my medical decision making (see chart for details).    Gillian ScarceKimberly S Schnyder is a 47 y.o. female with history of IBS who presents to emergency department for lower abdominal pain and diarrhea for the last several days.  More severe than her typical flares and not improved with her home medication regimen.  On exam, patient is afebrile, hemodynamically stable with focal area of tenderness to the left lower quadrant.  He did report blood in her stool.  Hemoccult is negative.  No signs of external hemorrhoid or fissure on GU exam.  Lipase mildly elevated at 52.  Would expect lipase to be much higher in the setting of acute pancreatitis and symptoms seem inconsistent with pancreatitis as well.  Remainder of blood work  reassuring.  UA reviewed with negative nitrites and leukocytes.  Does show 6-30 WBCs and many bacteria, however patient is not experiencing any urinary symptoms.  CT without acute findings.  On reevaluation, patient feels improved.  She is tolerating p.o. and feels comfortable with discharged home.  She will follow-up with her GI doctor.  Reasons to return to the ER were discussed and all questions answered.  Patient discussed with Dr. Madilyn Hook who agrees with treatment plan.    Final Clinical Impressions(s) / ED Diagnoses   Final diagnoses:  Diarrhea, unspecified type  Lower abdominal pain    ED Discharge Orders        Ordered    promethazine (PHENERGAN) 25 MG tablet  Every 6 hours PRN     04/03/18 1315       Kaleel Schmieder, Chase Picket, PA-C 04/03/18 1322    Tilden Fossa, MD 04/04/18 [phone]

## 2018-04-03 NOTE — Discharge Instructions (Signed)
It was my pleasure taking care of you today!  I have refilled your nausea medication. Please call Dr. Loreta AveMann today to schedule a follow-up appointment for further discussion of your symptoms today.  Return to the emergency department for uncontrolled vomiting, fevers, acute worsening of your pain, new or worsening symptoms, any additional concerns.

## 2018-08-26 ENCOUNTER — Other Ambulatory Visit: Payer: Self-pay | Admitting: *Deleted

## 2018-08-26 DIAGNOSIS — Z1231 Encounter for screening mammogram for malignant neoplasm of breast: Secondary | ICD-10-CM

## 2018-08-27 ENCOUNTER — Ambulatory Visit
Admission: RE | Admit: 2018-08-27 | Discharge: 2018-08-27 | Disposition: A | Discharge status: Home / Self Care | Payer: PRIVATE HEALTH INSURANCE | Source: Ambulatory Visit | Attending: *Deleted | Admitting: *Deleted

## 2018-08-27 ENCOUNTER — Ambulatory Visit: Payer: PRIVATE HEALTH INSURANCE | Admitting: Certified Nurse Midwife

## 2018-08-27 ENCOUNTER — Other Ambulatory Visit: Payer: Self-pay | Admitting: *Deleted

## 2018-08-27 DIAGNOSIS — Z1231 Encounter for screening mammogram for malignant neoplasm of breast: Secondary | ICD-10-CM

## 2019-09-24 IMAGING — MG DIGITAL SCREENING BILATERAL MAMMOGRAM WITH IMPLANTS, CAD AND TOM
8 of 13 series · 8 of 29 positions shown · non-contrast
Comparison: Previous exam(s).

CLINICAL DATA: Screening.

EXAM:
DIGITAL SCREENING BILATERAL MAMMOGRAM WITH IMPLANTS, CAD AND TOMO
The patient has retropectoral implants. Standard and implant
displaced views were performed.

[R MLO]
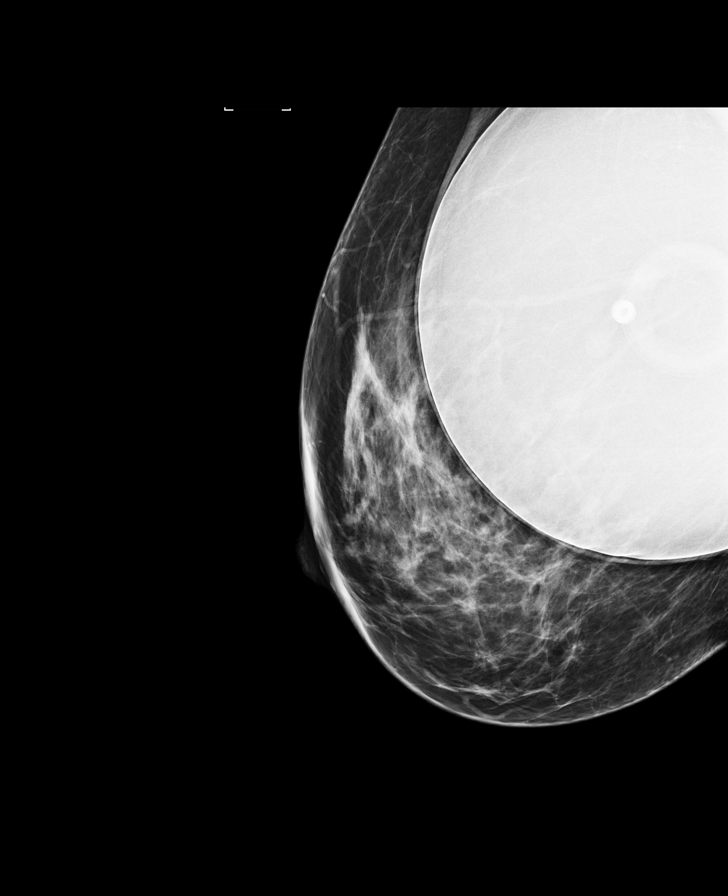

[L MLO (1 of 2)]
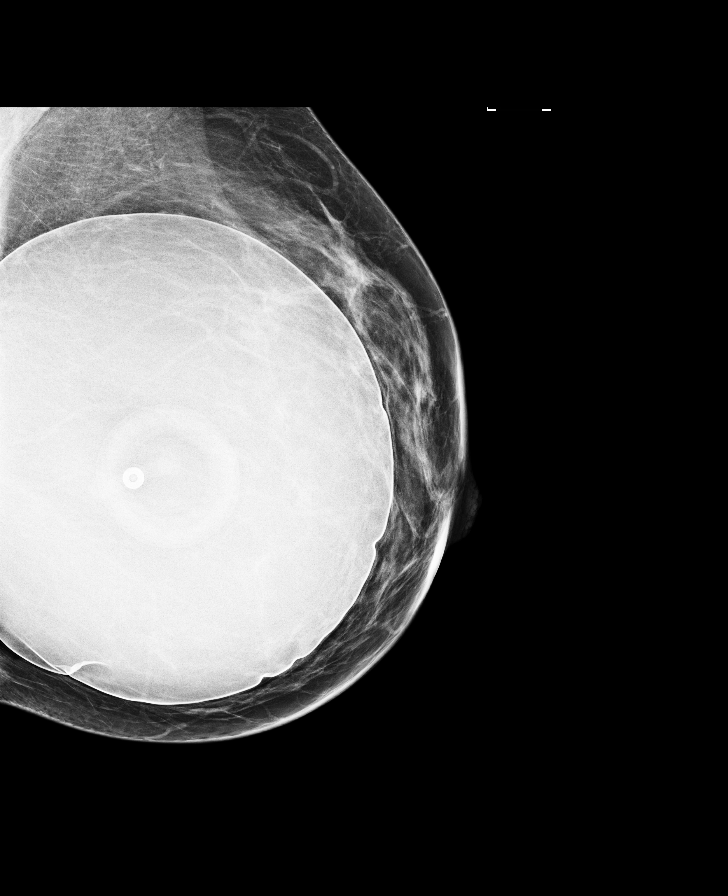

[L CC]
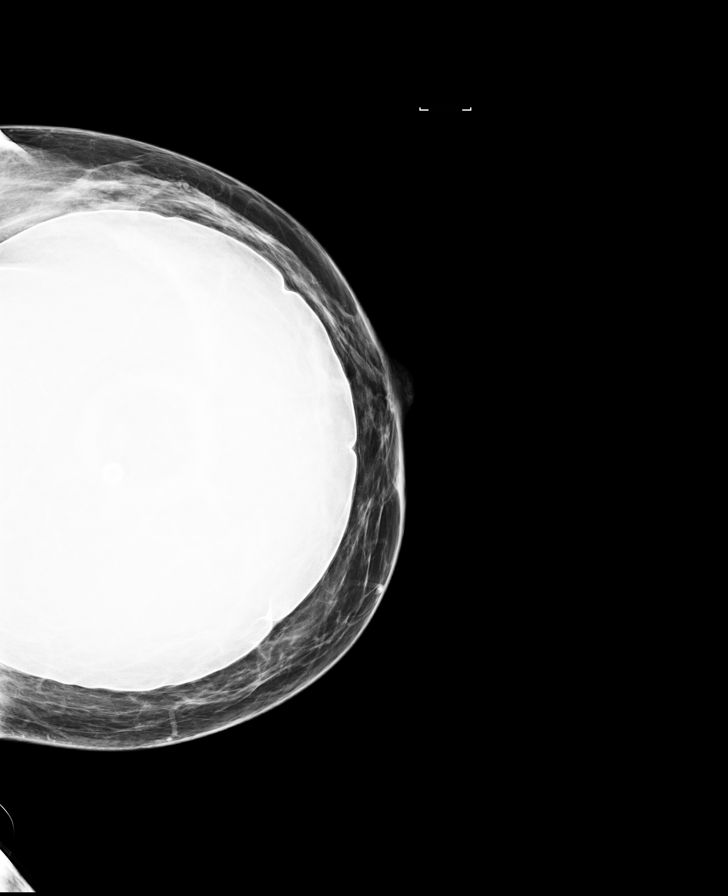

[R CC]
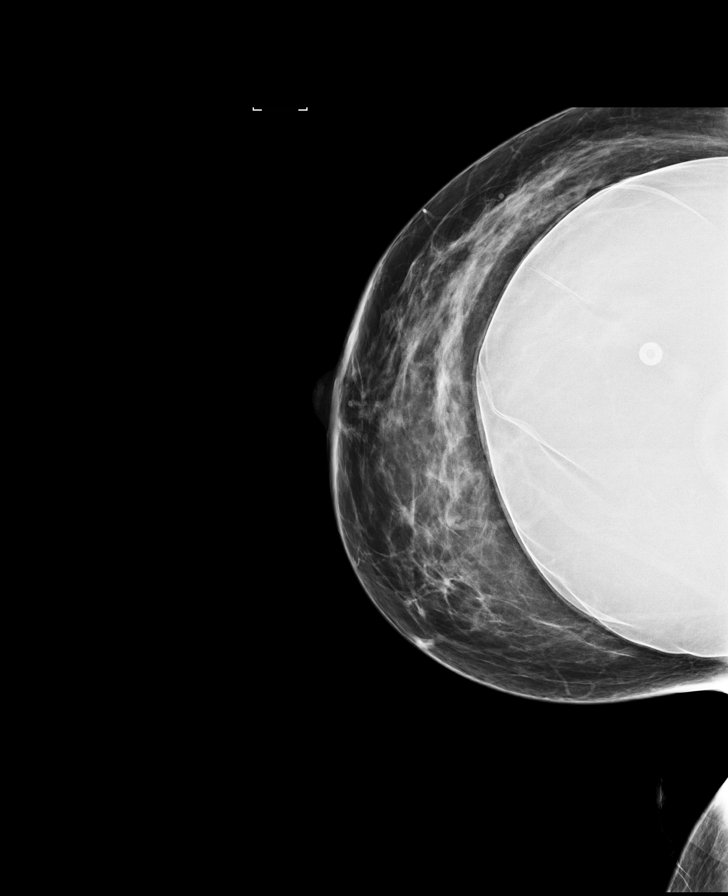

[L MLO (2 of 2)]
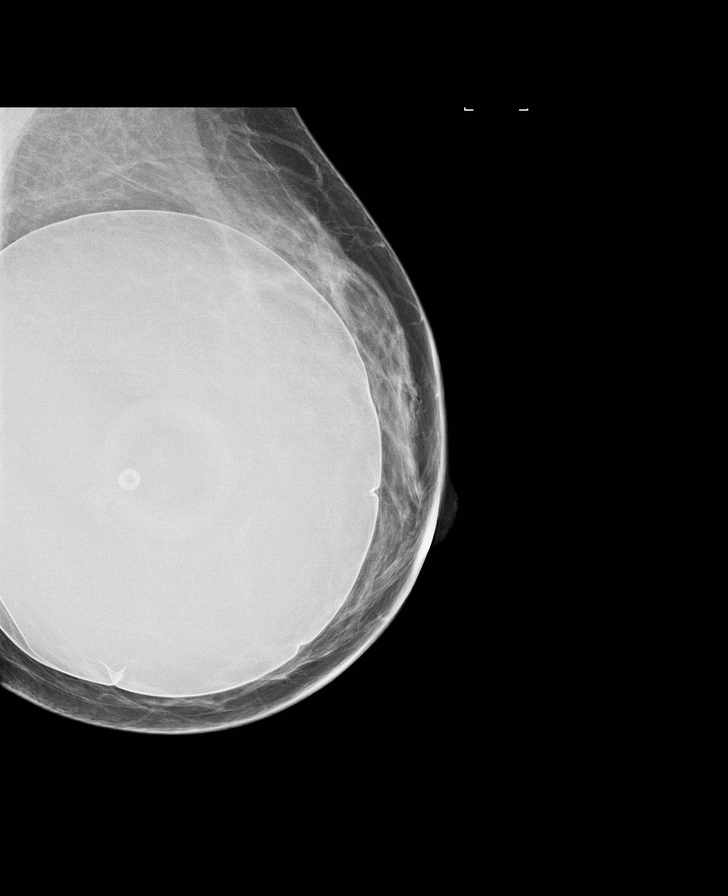

[R CC synth-2D]
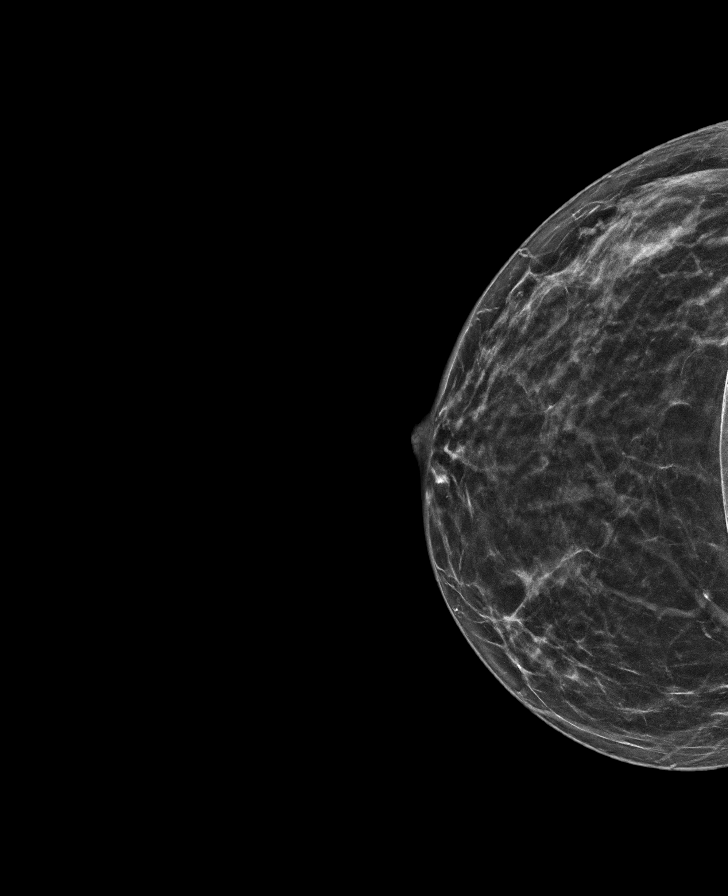

[L MLO synth-2D]
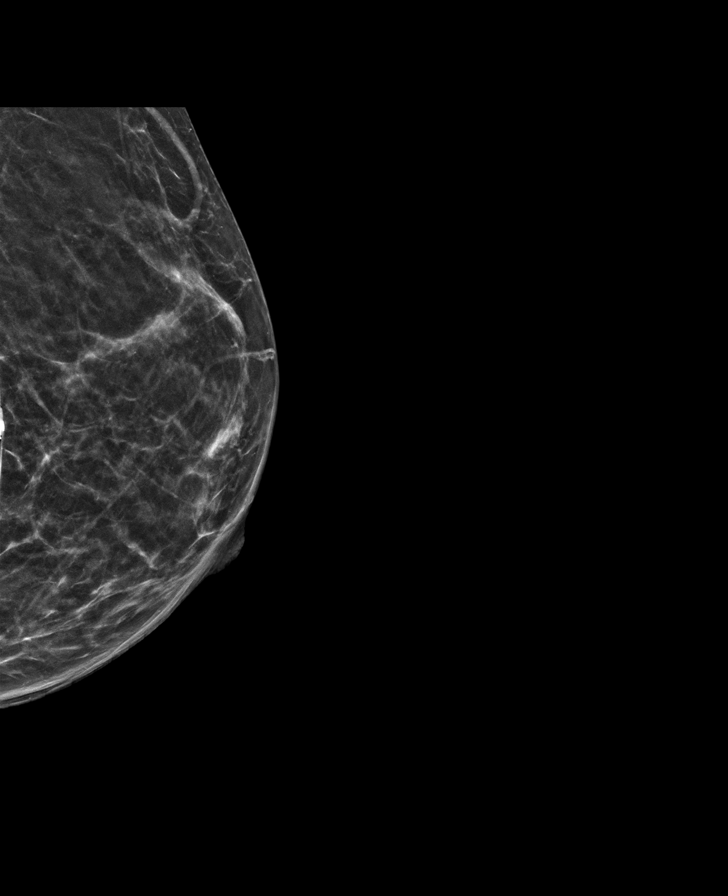

[R MLO synth-2D]
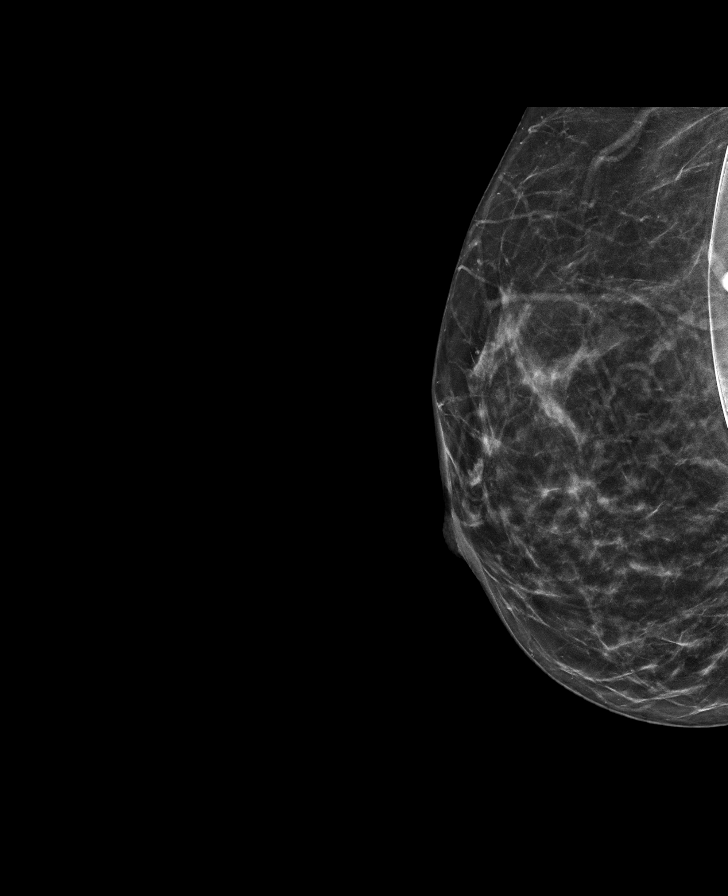

[8 of 29 positions shown; findings below may reference images not displayed]

ACR Breast Density Category c: The breast tissue is heterogeneously
dense, which may obscure small masses.
FINDINGS: There are no findings suspicious for malignancy. Images were
processed with CAD.
IMPRESSION: No mammographic evidence of malignancy. A result letter of this
screening mammogram will be mailed directly to the patient.

RECOMMENDATION:
Screening mammogram in one year. (Code:49-X-OQ9)

BI-RADS CATEGORY  1:  Negative.

## 2019-11-03 ENCOUNTER — Other Ambulatory Visit: Payer: Self-pay | Admitting: *Deleted

## 2019-11-03 DIAGNOSIS — Z1231 Encounter for screening mammogram for malignant neoplasm of breast: Secondary | ICD-10-CM

## 2019-11-04 ENCOUNTER — Other Ambulatory Visit: Payer: Self-pay

## 2019-11-04 ENCOUNTER — Ambulatory Visit
Admission: RE | Admit: 2019-11-04 | Discharge: 2019-11-04 | Disposition: A | Discharge status: Home / Self Care | Payer: Managed Care, Other (non HMO) | Source: Ambulatory Visit | Attending: *Deleted | Admitting: *Deleted

## 2019-11-04 DIAGNOSIS — Z1231 Encounter for screening mammogram for malignant neoplasm of breast: Secondary | ICD-10-CM

## 2020-01-20 ENCOUNTER — Encounter: Payer: Self-pay | Admitting: Certified Nurse Midwife

## 2021-11-15 ENCOUNTER — Other Ambulatory Visit: Payer: Self-pay | Admitting: *Deleted

## 2021-11-15 DIAGNOSIS — R748 Abnormal levels of other serum enzymes: Secondary | ICD-10-CM

## 2023-01-24 ENCOUNTER — Other Ambulatory Visit: Payer: Self-pay | Admitting: *Deleted

## 2023-01-24 ENCOUNTER — Ambulatory Visit: Payer: Managed Care, Other (non HMO)

## 2023-01-24 DIAGNOSIS — Z1231 Encounter for screening mammogram for malignant neoplasm of breast: Secondary | ICD-10-CM

## 2023-01-25 ENCOUNTER — Ambulatory Visit
Admission: RE | Admit: 2023-01-25 | Discharge: 2023-01-25 | Disposition: A | Discharge status: Home / Self Care | Payer: Managed Care, Other (non HMO) | Source: Ambulatory Visit | Attending: *Deleted | Admitting: *Deleted

## 2023-01-25 DIAGNOSIS — Z1231 Encounter for screening mammogram for malignant neoplasm of breast: Secondary | ICD-10-CM

## 2023-09-11 ENCOUNTER — Other Ambulatory Visit: Payer: Self-pay | Admitting: *Deleted

## 2023-09-11 ENCOUNTER — Ambulatory Visit
Admission: RE | Admit: 2023-09-11 | Discharge: 2023-09-11 | Disposition: A | Discharge status: Home / Self Care | Payer: Managed Care, Other (non HMO) | Source: Ambulatory Visit | Attending: *Deleted | Admitting: *Deleted

## 2023-09-11 DIAGNOSIS — R1030 Lower abdominal pain, unspecified: Secondary | ICD-10-CM

## 2023-09-11 DIAGNOSIS — K625 Hemorrhage of anus and rectum: Secondary | ICD-10-CM

## 2023-09-11 MED ORDER — IOPAMIDOL (ISOVUE-300) INJECTION 61%
100.0000 mL | Freq: Once | INTRAVENOUS | Status: AC | PRN
Start: 2023-09-11 — End: 2023-09-11
  Administered 2023-09-11: 100 mL via INTRAVENOUS

## 2023-09-25 ENCOUNTER — Encounter: Payer: Self-pay | Admitting: *Deleted

## 2023-11-14 ENCOUNTER — Other Ambulatory Visit: Payer: Self-pay | Admitting: Plastic Surgery

## 2023-11-14 DIAGNOSIS — Z01818 Encounter for other preprocedural examination: Secondary | ICD-10-CM

## 2023-11-19 ENCOUNTER — Ambulatory Visit
Admission: RE | Admit: 2023-11-19 | Discharge: 2023-11-19 | Disposition: A | Discharge status: Home / Self Care | Payer: Managed Care, Other (non HMO) | Source: Ambulatory Visit | Attending: Plastic Surgery | Admitting: Plastic Surgery

## 2023-11-19 DIAGNOSIS — Z01818 Encounter for other preprocedural examination: Secondary | ICD-10-CM

## 2024-11-05 ENCOUNTER — Other Ambulatory Visit: Payer: Self-pay | Admitting: Physician Assistant

## 2024-11-05 ENCOUNTER — Ambulatory Visit
Admission: RE | Admit: 2024-11-05 | Discharge: 2024-11-05 | Disposition: A | Discharge status: Home / Self Care | Source: Ambulatory Visit | Attending: Physician Assistant | Admitting: Physician Assistant

## 2024-11-05 DIAGNOSIS — R590 Localized enlarged lymph nodes: Secondary | ICD-10-CM

## 2024-11-05 DIAGNOSIS — R600 Localized edema: Secondary | ICD-10-CM

## 2024-11-05 DIAGNOSIS — R928 Other abnormal and inconclusive findings on diagnostic imaging of breast: Secondary | ICD-10-CM

## 2024-11-05 DIAGNOSIS — N6310 Unspecified lump in the right breast, unspecified quadrant: Secondary | ICD-10-CM

## 2024-11-06 ENCOUNTER — Encounter (HOSPITAL_COMMUNITY): Payer: Self-pay

## 2024-11-06 ENCOUNTER — Ambulatory Visit
Admission: RE | Admit: 2024-11-06 | Discharge: 2024-11-06 | Disposition: A | Source: Ambulatory Visit | Attending: Physician Assistant | Admitting: Physician Assistant

## 2024-11-06 ENCOUNTER — Ambulatory Visit

## 2024-11-06 ENCOUNTER — Encounter: Payer: Self-pay | Admitting: Cardiology

## 2024-11-06 ENCOUNTER — Ambulatory Visit: Attending: Cardiology | Admitting: Cardiology

## 2024-11-06 VITALS — BP 130/80 | HR 92 | Ht 64.0 in | Wt 139.0 lb

## 2024-11-06 DIAGNOSIS — R072 Precordial pain: Secondary | ICD-10-CM

## 2024-11-06 DIAGNOSIS — R002 Palpitations: Secondary | ICD-10-CM | POA: Diagnosis not present

## 2024-11-06 DIAGNOSIS — R928 Other abnormal and inconclusive findings on diagnostic imaging of breast: Secondary | ICD-10-CM

## 2024-11-06 DIAGNOSIS — R079 Chest pain, unspecified: Secondary | ICD-10-CM

## 2024-11-06 DIAGNOSIS — N6310 Unspecified lump in the right breast, unspecified quadrant: Secondary | ICD-10-CM

## 2024-11-06 DIAGNOSIS — E782 Mixed hyperlipidemia: Secondary | ICD-10-CM | POA: Diagnosis not present

## 2024-11-06 DIAGNOSIS — R590 Localized enlarged lymph nodes: Secondary | ICD-10-CM

## 2024-11-06 MED ORDER — ROSUVASTATIN CALCIUM 10 MG PO TABS: 10.0000 mg | ORAL_TABLET | Freq: Every day | ORAL | 3 refills | Status: AC

## 2024-11-06 MED ORDER — METOPROLOL TARTRATE 100 MG PO TABS: 100.0000 mg | ORAL_TABLET | Freq: Once | ORAL | 0 refills | Status: AC

## 2024-11-06 MED ORDER — ASPIRIN 81 MG PO TBEC: 81.0000 mg | DELAYED_RELEASE_TABLET | Freq: Every day | ORAL | Status: AC

## 2024-11-06 NOTE — Patient Instructions (Addendum)
 Medication Instructions:  Your physician has recommended you make the following change in your medication:   START Rosuvastatin  (Crestor ) 10 mg taking 1 daily  START Aspirin  81 mg taking 1 daily.  This is over the counter  *If you need a refill on your cardiac medications before your next appointment, please call your pharmacy*  Lab Work: None ordered  If you have labs (blood work) drawn today and your tests are completely normal, you will receive your results only by: MyChart Message (if you have MyChart) OR A paper copy in the mail If you have any lab test that is abnormal or we need to change your treatment, we will call you to review the results.  Testing/Procedures: Your physician has requested that you have an echocardiogram. Echocardiography is a painless test that uses sound waves to create images of your heart. It provides your doctor with information about the size and shape of your heart and how well your hearts chambers and valves are working. This procedure takes approximately one hour. There are no restrictions for this procedure. Please do NOT wear cologne, perfume, aftershave, or lotions (deodorant is allowed). Please arrive 15 minutes prior to your appointment time.  Please note: We ask at that you not bring children with you during ultrasound (echo/ vascular) testing. Due to room size and safety concerns, children are not allowed in the ultrasound rooms during exams. Our front office staff cannot provide observation of children in our lobby area while testing is being conducted. An adult accompanying a patient to their appointment will only be allowed in the ultrasound room at the discretion of the ultrasound technician under special circumstances. We apologize for any inconvenience.    Your physician has requested that you have cardiac CT. Cardiac computed tomography (CT) is a painless test that uses an x-ray machine to take clear, detailed pictures of your heart. For  further information please visit [email or web address] Please follow instruction sheet  BELOW:    Your cardiac CT is scheduled at :    [facility] [address]  [place], [place] [address]  11/10/2024 ARRIVE AT 8:30 A.M.   If scheduled at the Heart and Vascular Tower at [address], please enter the parking lot using the [address] entrance and use the FREE valet service at the patient drop-off area. Enter the building and check-in with registration on the main floor.  Please follow these instructions carefully (unless otherwise directed):  An IV will be required for this test and Nitroglycerin  will be given.  Hold all erectile dysfunction medications at least 3 days (72 hrs) prior to test. (Ie viagra, cialis, sildenafil, tadalafil, etc)   On the Night Before the Test: Be sure to Drink plenty of water. Do not consume any caffeinated/decaffeinated beverages or chocolate 12 hours prior to your test. Do not take any antihistamines 12 hours prior to your test.   On the Day of the Test: Drink plenty of water until 1 hour prior to the test. Do not eat any food 1 hour prior to test. You may take your regular medications prior to the test.  Take metoprolol  (Lopressor ) 100 MG  two hours prior to test.  THIS HAS BEEN SENT TO [facility]- please wear underwire-free bra if available, avoid dresses & tight clothing       After the Test: Drink plenty of water. After receiving IV contrast, you may experience a mild flushed feeling. This is normal. On occasion, you may experience  a mild rash up to 24 hours after the test. This is not dangerous. If this occurs, you can take Benadryl 25 mg, Zyrtec, Claritin, or Allegra and increase your fluid intake. (Patients taking Tikosyn should avoid Benadryl, and may take Zyrtec, Claritin, or Allegra) If you experience trouble breathing, this can be serious. If it is severe call 911 IMMEDIATELY. If it is mild, please  call our office.  We will call to schedule your test 2-4 weeks out understanding that some insurance companies will need an authorization prior to the service being performed.   For more information and frequently asked questions, please visit our website : [email or web address]  For non-scheduling related questions, please contact the cardiac imaging nurse navigator should you have any questions/concerns: Cardiac Imaging Nurse Navigators Direct Office Dial: [phone]   For scheduling needs, including cancellations and rescheduling, please call Brittany, [phone]. .    Follow-Up: At [facility], you and your health needs are our priority.  As part of our continuing mission to provide you with exceptional heart care, our providers are all part of one team.  This team includes your primary Cardiologist (physician) and Advanced Practice Providers or APPs (Physician Assistants and Nurse Practitioners) who all work together to provide you with the care you need, when you need it.  Your next appointment:   1-2 WEEKS AFTER ECHOCARDIOGRAM  Provider:   Wilbert Bihari, MD    We recommend signing up for the patient portal called MyChart.  Sign up information is provided on this After Visit Summary.  MyChart is used to connect with patients for Virtual Visits (Telemedicine).  Patients are able to view lab/test results, encounter notes, upcoming appointments, etc.  Non-urgent messages can be sent to your provider as well.   To learn more about what you can do with MyChart, go to [email or web address].   Other Instructions

## 2024-11-06 NOTE — Progress Notes (Signed)
 " Cardiology Office Note   Date:  11/06/2024  ID:  Cynthia Parker, DOB [birth year], MRN [id number] PCP: Patient, No Pcp Per  [facility] Providers Cardiologist:  Georganna Archer, MD  History of Present Illness Cynthia Parker is a 54 y.o. female with a past medical history of HTN.  Patient was seen by her PCP on 11/04/24. At that time, patient reported 2-3 weeks of mild-moderate sternal chest pain with nonexertional activities. Patient had a breast reduction surgery in 12/2023 and had 2 implants put in. She had nodularity in the right breast and R axillary lymphadenopathy. She was referred to cardiology.   Today, patient presents to establish care with cardiology. She recently had a mammogram, and was found to have heavily calcified vessels in her breast tissue. She also tells me that she has a significant family history of coronary artery disease, mostly on her dad's side. Her uncle had an MI in his 35s. Her grandfather and his brothers all died from massive heart attacks. She also had an aunt pass away at 60 years old from a cardiac condition. Patient occasionally has episodes of chest tightness. These tend to occur more when she is at rest. She has not been as active as she use to be, so she notices some shortness of breath with exertion. She has palpitations a few times per week. Palpitations occur almost every day. Describes feeling like her heart is racing. Symptoms occur randomly. She recently has been having episodes of lower extremity swelling. This occurred suddenly a few days ago, and she also noticed that her legs were red and itchy. Her PCP ruled out DVT and is in the process of working her up for autoimmune conditions    Studies Reviewed  EKG from 11/04/24 showed normal sinus rhythm with HR 87 BPM.   Risk Assessment/Calculations           Physical Exam VS:  BP 130/80   Pulse 92   Ht 5' 4 (1.626 m)   Wt 139 lb 0.6 oz (63.1 kg)   SpO2 95%   BMI 23.87 kg/m        Wt  Readings from Last 3 Encounters:  11/06/24 139 lb 0.6 oz (63.1 kg)  04/03/18 131 lb (59.4 kg)  08/07/17 132 lb (59.9 kg)    GEN: Well nourished, well developed in no acute distress. Sitting comfortably on the exam table  NECK: No JVD; No carotid bruits CARDIAC:  RRR, no murmurs, rubs, gallops. Radial pulses 2+ bilaterally  RESPIRATORY:  Clear to auscultation without rales, wheezing or rhonchi. Normal WOB on room air   ABDOMEN: Soft, non-tender, non-distended EXTREMITIES:  No edema in BLE; No deformity   ASSESSMENT AND PLAN  Family history of CAD  Chest tightness  - Patient has a significant family history of CAD, mostly on her Father's side. Her grandfather and his brothers all had massive heart attacks. Her uncle had MI in his 61s  - She recently had a mammogram that showed heavily calcified blood vessels. Her PCP recommended follow up with a cardiologist  - She has occasional episodes of chest tightness. This occurs randomly. Not exertional - EKG 1/7 without ischemic changes  - Ordered coronary CTA. She denies contrast allergy. Creatinine 0.85 yesterday through PCP  - Ordered echocardiogram  - Start ASA 81 mg daily and crestor  10 mg daily   HLD  - Lipid panel from 1/7 showed LDL 127, total cholesterol 223, HDL 76  - Start crestor  10 mg daily  -  Repeat lipids, LFTs at follow up   Palpitations  - Patient has been having daily episodes of palpitations. Describes feeling like her heart is racing. Cannot identify any triggers  - K 4.5, TSH within normal limits on 1/7  - Ordered 7 day monitor  - Ordered echocardiogram   Leg swelling  - Patient had sudden onset leg swelling while at work a few days ago. Her leg was also red and itchy. Improved with benadryl  - PCP has ruled out DVT and is working up her up for autoimmune conditions  - Patient no longer having leg swelling  - Overall seems atypical for a cardiac cause. Ordered echocardiogram for above reasons.   Dispo: Follow up  with Dr. Floretta after testing   Signed, Rollo FABIENE Louder, PA-C   "

## 2024-11-06 NOTE — Progress Notes (Unsigned)
 Enrolled patient for a 7 day Zio XT monitor to be mailed to patients home.

## 2024-11-10 ENCOUNTER — Ambulatory Visit (HOSPITAL_COMMUNITY)
Admission: RE | Admit: 2024-11-10 | Discharge: 2024-11-10 | Disposition: A | Discharge status: Home / Self Care | Source: Ambulatory Visit | Attending: Cardiology | Admitting: Cardiology

## 2024-11-10 ENCOUNTER — Ambulatory Visit: Payer: Self-pay | Admitting: Cardiology

## 2024-11-10 DIAGNOSIS — R072 Precordial pain: Secondary | ICD-10-CM | POA: Insufficient documentation

## 2024-11-10 MED ORDER — NITROGLYCERIN 0.4 MG SL SUBL
0.8000 mg | SUBLINGUAL_TABLET | Freq: Once | SUBLINGUAL | Status: AC
  Administered 2024-11-10: 0.8 mg via SUBLINGUAL

## 2024-11-10 MED ORDER — DILTIAZEM HCL 25 MG/5ML IV SOLN: 10.0000 mg | INTRAVENOUS | Status: DC | PRN

## 2024-11-10 MED ORDER — METOPROLOL TARTRATE 5 MG/5ML IV SOLN
10.0000 mg | Freq: Once | INTRAVENOUS | Status: AC | PRN
  Administered 2024-11-10: 5 mg via INTRAVENOUS

## 2024-11-10 MED ORDER — IOHEXOL 350 MG/ML SOLN
100.0000 mL | Freq: Once | INTRAVENOUS | Status: AC | PRN
  Administered 2024-11-10: 100 mL via INTRAVENOUS

## 2024-11-30 ENCOUNTER — Ambulatory Visit (HOSPITAL_COMMUNITY)

## 2024-12-08 ENCOUNTER — Ambulatory Visit: Admitting: Student in an Organized Health Care Education/Training Program

## 2024-12-30 ENCOUNTER — Ambulatory Visit (HOSPITAL_COMMUNITY)

## 2025-01-19 ENCOUNTER — Ambulatory Visit: Admitting: Cardiology
